# Patient Record
Sex: Female | Born: 2000 | Race: Black or African American | Hispanic: No | Marital: Single | Smoking: Never smoker
Health system: Southern US, Community
[De-identification: ages and names within clinical notes are randomized; demographics above are authoritative.]

## PROBLEM LIST (undated history)

## (undated) DIAGNOSIS — F419 Anxiety disorder, unspecified: Secondary | ICD-10-CM

---
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
## 2023-03-30 ENCOUNTER — Other Ambulatory Visit: Payer: Self-pay | Admitting: Neurology

## 2023-03-30 DIAGNOSIS — R251 Tremor, unspecified: Secondary | ICD-10-CM

## 2023-03-31 ENCOUNTER — Ambulatory Visit
Admission: RE | Admit: 2023-03-31 | Discharge: 2023-03-31 | Disposition: A | Discharge status: Home / Self Care | Payer: Medicaid Other | Source: Ambulatory Visit | Attending: Neurology | Admitting: Neurology

## 2023-03-31 DIAGNOSIS — R251 Tremor, unspecified: Secondary | ICD-10-CM | POA: Diagnosis present

## 2023-03-31 MED ORDER — GADOBUTROL 1 MMOL/ML IV SOLN
6.0000 mL | Freq: Once | INTRAVENOUS | Status: AC | PRN
  Administered 2023-03-31: 6 mL via INTRAVENOUS

## 2023-04-28 ENCOUNTER — Other Ambulatory Visit: Payer: Self-pay | Admitting: Neurology

## 2023-04-28 DIAGNOSIS — G379 Demyelinating disease of central nervous system, unspecified: Secondary | ICD-10-CM

## 2023-05-09 ENCOUNTER — Encounter: Payer: Self-pay | Admitting: Neurology

## 2023-05-20 ENCOUNTER — Other Ambulatory Visit: Payer: Medicaid Other

## 2023-05-20 ENCOUNTER — Inpatient Hospital Stay: Admission: RE | Admit: 2023-05-20 | Payer: Medicaid Other | Source: Ambulatory Visit

## 2023-05-21 ENCOUNTER — Other Ambulatory Visit: Payer: Self-pay

## 2023-05-21 ENCOUNTER — Emergency Department
Admission: EM | Admit: 2023-05-21 | Discharge: 2023-05-21 | Disposition: A | Discharge status: Home / Self Care | Payer: Medicaid Other | Attending: Emergency Medicine | Admitting: Emergency Medicine

## 2023-05-21 DIAGNOSIS — R259 Unspecified abnormal involuntary movements: Secondary | ICD-10-CM | POA: Diagnosis present

## 2023-05-21 DIAGNOSIS — G249 Dystonia, unspecified: Secondary | ICD-10-CM | POA: Diagnosis not present

## 2023-05-21 LAB — T4, FREE: Free T4: 0.78 ng/dL (ref 0.61–1.12)

## 2023-05-21 LAB — CBC WITH DIFFERENTIAL/PLATELET
Abs Immature Granulocytes: 0.03 10*3/uL (ref 0.00–0.07)
Basophils Absolute: 0.1 10*3/uL (ref 0.0–0.1)
Basophils Relative: 1 %
Eosinophils Absolute: 0.1 10*3/uL (ref 0.0–0.5)
Eosinophils Relative: 1 %
HCT: 35.4 % — ABNORMAL LOW (ref 36.0–46.0)
Hemoglobin: 11.3 g/dL — ABNORMAL LOW (ref 12.0–15.0)
Immature Granulocytes: 0 %
Lymphocytes Relative: 32 %
Lymphs Abs: 3.4 10*3/uL (ref 0.7–4.0)
MCH: 28.6 pg (ref 26.0–34.0)
MCHC: 31.9 g/dL (ref 30.0–36.0)
MCV: 89.6 fL (ref 80.0–100.0)
Monocytes Absolute: 1.1 10*3/uL — ABNORMAL HIGH (ref 0.1–1.0)
Monocytes Relative: 11 %
Neutro Abs: 6 10*3/uL (ref 1.7–7.7)
Neutrophils Relative %: 55 %
Platelets: 304 10*3/uL (ref 150–400)
RBC: 3.95 MIL/uL (ref 3.87–5.11)
RDW: 13.2 % (ref 11.5–15.5)
WBC: 10.7 10*3/uL — ABNORMAL HIGH (ref 4.0–10.5)
nRBC: 0 % (ref 0.0–0.2)

## 2023-05-21 LAB — TSH: TSH: 2.838 u[IU]/mL (ref 0.350–4.500)

## 2023-05-21 LAB — COMPREHENSIVE METABOLIC PANEL
ALT: 15 U/L (ref 0–44)
AST: 21 U/L (ref 15–41)
Albumin: 3.8 g/dL (ref 3.5–5.0)
Alkaline Phosphatase: 58 U/L (ref 38–126)
Anion gap: 7 (ref 5–15)
BUN: 16 mg/dL (ref 6–20)
CO2: 26 mmol/L (ref 22–32)
Calcium: 9.2 mg/dL (ref 8.9–10.3)
Chloride: 103 mmol/L (ref 98–111)
Creatinine, Ser: 0.68 mg/dL (ref 0.44–1.00)
GFR, Estimated: >60 mL/min (ref >60)
Glucose, Bld: 116 mg/dL — ABNORMAL HIGH (ref 70–99)
Potassium: 3.8 mmol/L (ref 3.5–5.1)
Sodium: 136 mmol/L (ref 135–145)
Total Bilirubin: <0.2 mg/dL (ref <1.2)
Total Protein: 7.9 g/dL (ref 6.5–8.1)

## 2023-05-21 LAB — HCG, QUANTITATIVE, PREGNANCY: hCG, Beta Chain, Quant, S: 1 m[IU]/mL (ref <5)

## 2023-05-21 LAB — MAGNESIUM: Magnesium: 1.8 mg/dL (ref 1.7–2.4)

## 2023-05-21 MED ORDER — DIPHENHYDRAMINE HCL 50 MG/ML IJ SOLN
50.0000 mg | Freq: Once | INTRAMUSCULAR | Status: AC
  Administered 2023-05-21: 50 mg via INTRAVENOUS
  Filled 2023-05-21: qty 1

## 2023-05-21 NOTE — Discharge Instructions (Signed)
 Thank you for choosing [facility] for your health care today!  Please see your primary doctor this week for a follow up appointment.   If you have any new, worsening, or unexpected symptoms call your doctor right away or come back to the emergency department for reevaluation.  It was my pleasure to care for you today.   Daneil Dan Modesto Charon, MD

## 2023-05-21 NOTE — ED Notes (Signed)
Pt ambulated unassisted to the bathroom. This nurse was at bedside for safety.

## 2023-05-21 NOTE — ED Provider Notes (Signed)
[facility] Provider Note    Event Date/Time   First MD Initiated Contact with Patient 05/21/23 [phone]     (approximate)   History   Medical Clearance   HPI  Karen Skinner is a 22 y.o. female   Past medical history of PTSD and anxiety who presents to the emergency department with abnormal movements.  According to the patient and her mother, regular state of health until this evening around 11 PM when she came down the stairs with jerking movements throughout her whole body, able to tell her mother that she was having these jerking movements and able to walk at that time, mother noted that she was dragging both of her feet while she walked.  Mother notes that she has had some dystonic reactions limited to various parts of her body but not sustained like tonight.  Patient denies any pain or discomfort.  No drug or alcohol use.  No changes in medications recently.  Takes duloxetine, longstanding.   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      Physical Exam   Triage Vital Signs: ED Triage Vitals  Encounter Vitals Group     BP 05/21/23 0040 122/80     Systolic BP Percentile --      Diastolic BP Percentile --      Pulse Rate 05/21/23 0040 (!) 106     Resp 05/21/23 0040 17     Temp 05/21/23 0040 (!) 97.4 F (36.3 C)     Temp Source 05/21/23 0040 Oral     SpO2 05/21/23 0040 100 %     Weight --      Height --      Head Circumference --      Peak Flow --      Pain Score 05/21/23 0041 0     Pain Loc --      Pain Education --      Exclude from Growth Chart --     Most recent vital signs: Vitals:   05/21/23 0040  BP: 122/80  Pulse: (!) 106  Resp: 17  Temp: (!) 97.4 F (36.3 C)  SpO2: 100%    General: Awake, no distress.  CV:  Good  peripheral perfusion.  Resp:  Normal effort.  Abd:  No distention.  Other:  Patient with occasional jerking motions throughout her entire body, distractible and subsides when engaged in conversation or asked to perform physical exam maneuvers.  Able to move all extremities.  No clonus.  Awake alert oriented cooperative.   ED Results / Procedures / Treatments   Labs (all labs ordered are listed, but only abnormal results are displayed) Labs Reviewed  COMPREHENSIVE METABOLIC PANEL - Abnormal; Notable for the following components:      Result Value   Glucose, Bld 116 (*)    All other components within normal limits  CBC WITH DIFFERENTIAL/PLATELET - Abnormal; Notable for the following components:   WBC 10.7 (*)    Hemoglobin 11.3 (*)    HCT 35.4 (*)    Monocytes Absolute 1.1 (*)    All other components within normal limits  HCG, QUANTITATIVE, PREGNANCY  MAGNESIUM  TSH  T4, FREE     I ordered and reviewed the above labs they are notable for normal cell counts, electrolytes, thyroid studies, pregnancy test negative.  EKG  ED ECG REPORT  Ross Marcus, the attending physician, personally viewed and interpreted this ECG.   Date: 05/21/2023  EKG Time: 0055  Rate: 100  Rhythm: sinus tachycardia  Axis: nl  Intervals:none  ST&T Change: no stemi    PROCEDURES:  Critical Care performed: No  Procedures   MEDICATIONS ORDERED IN ED: Medications  diphenhydrAMINE (BENADRYL) injection 50 mg (50 mg Intravenous Given 05/21/23 0049)     IMPRESSION / MDM / ASSESSMENT AND PLAN / ED COURSE  I reviewed the triage vital signs and the nursing notes.                                Patient's presentation is most consistent with acute presentation with potential threat to life or bodily function.  Differential diagnosis includes, but is not limited to, dystonic reaction, functional neurologic disorder, electrolyte disturbance, arrhythmia, thyroid dysfunction, considered but less  likely seizure or stroke.   The patient is on the cardiac monitor to evaluate for evidence of arrhythmia and/or significant heart rate changes.  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  Check basic labs, EKG, pregnancy.  Give Benadryl.  Without intervention, jerking movements have mostly subsided.  If labs unremarkable, patient remains well, plan will be for discharge and neurology follow-up.   Reassessed and resting comfortably.  Moving all extremities.  Reviewed lab testing with patient and mother.  All normal.  Will follow-up with neurologist.      FINAL CLINICAL IMPRESSION(S) / ED DIAGNOSES   Final diagnoses:  Dystonia     Rx / DC Orders   ED Discharge Orders     None        Note:  This document was prepared using Dragon voice recognition software and may include unintentional dictation errors.Pilar Jarvis, MD 05/21/23 [phone]

## 2023-05-21 NOTE — ED Triage Notes (Signed)
Patient is having dystonic type movements in the ED. Pt A/ox4, able to answer questions. Pt not in distress at this time.

## 2024-02-02 ENCOUNTER — Ambulatory Visit
Admission: EM | Admit: 2024-02-02 | Discharge: 2024-02-02 | Disposition: A | Discharge status: Home / Self Care | Attending: Emergency Medicine | Admitting: Emergency Medicine

## 2024-02-02 ENCOUNTER — Encounter: Payer: Self-pay | Admitting: Emergency Medicine

## 2024-02-02 DIAGNOSIS — J069 Acute upper respiratory infection, unspecified: Secondary | ICD-10-CM | POA: Diagnosis present

## 2024-02-02 DIAGNOSIS — H9203 Otalgia, bilateral: Secondary | ICD-10-CM | POA: Insufficient documentation

## 2024-02-02 LAB — SARS CORONAVIRUS 2 BY RT PCR: SARS Coronavirus 2 by RT PCR: NEGATIVE

## 2024-02-02 LAB — GROUP A STREP BY PCR: Group A Strep by PCR: NOT DETECTED

## 2024-02-02 MED ORDER — ALBUTEROL SULFATE HFA 108 (90 BASE) MCG/ACT IN AERS: 1.0000 | INHALATION_SPRAY | RESPIRATORY_TRACT | 0 refills | Status: AC | PRN

## 2024-02-02 MED ORDER — PREDNISONE 20 MG PO TABS
40.0000 mg | ORAL_TABLET | Freq: Every day | ORAL | 0 refills | Status: AC
Start: 2024-02-02 — End: 2024-02-07

## 2024-02-02 MED ORDER — IBUPROFEN 600 MG PO TABS: 600.0000 mg | ORAL_TABLET | Freq: Three times a day (TID) | ORAL | 0 refills | Status: AC | PRN

## 2024-02-02 MED ORDER — AEROCHAMBER MV MISC: 1 refills | Status: AC

## 2024-02-02 MED ORDER — FLUTICASONE PROPIONATE 50 MCG/ACT NA SUSP: 2.0000 | Freq: Every day | NASAL | 0 refills | Status: AC

## 2024-02-02 MED ORDER — PROMETHAZINE-DM 6.25-15 MG/5ML PO SYRP: 5.0000 mL | ORAL_SOLUTION | Freq: Four times a day (QID) | ORAL | 0 refills | Status: AC | PRN

## 2024-02-02 NOTE — ED Triage Notes (Signed)
 Pt presents c/o sore throat,a productive cough, nasal congestion and bilateral ear pain x 2 days. Pt reports coughing up greens mucus. Pt denies emesis and diarrhea but does c/o nausea. Pt has not tried any OTC products to help relieve the sxs.

## 2024-02-02 NOTE — ED Provider Notes (Signed)
 HPI  SUBJECTIVE:  Karen Skinner is a 23 y.o. female who presents with bilateral ear pain, decreased hearing, nasal congestion, fatigue, clear rhinorrhea, postnasal drip, sore throat starting yesterday.  She reports cough productive of mucus, chest soreness from coughing, intermittent shortness of breath, and is unable to sleep at night because of the cough some nausea, no vomiting, diarrhea, abdominal pain, body aches or headaches.  No wheezing, no fevers, recent swimming, sinus pain or pressure.  No allergy symptoms.  She uses Q-tips and earbuds and also grinds her teeth.  No known strep, COVID, flu exposure, but she has multiple family members with similar symptoms.  No antibiotics in the past month.  No antipyretic in the past 6 hours.  She tried an unknown pill and throat lozenges without improvement in her symptoms.  Symptoms worse with eating exertion.  She has a past history of frequent otitis media, anxiety/panic attack.  No history of TMJ arthralgia, allergies, diabetes, asthma.  LMP: Last week.  Denies possibility of being pregnant.  PCP: None    Past Medical History:  Diagnosis Date   Anxiety     History reviewed. No pertinent surgical history.  Family History  Problem Relation Age of Onset   Healthy Mother    Healthy Father     Social History   Tobacco Use   Smoking status: Never    Passive exposure: Never   Smokeless tobacco: Never  Vaping Use   Vaping status: Never Used  Substance Use Topics   Alcohol use: No   Drug use: No    No current facility-administered medications for this encounter.  Current Outpatient Medications:    albuterol  (VENTOLIN  HFA) 108 (90 Base) MCG/ACT inhaler, Inhale 1-2 puffs into the lungs every 4 (four) hours as needed for wheezing or shortness of breath., Disp: 1 each, Rfl: 0   busPIRone (BUSPAR) 7.5 MG tablet, Take 7.5 mg by mouth., Disp: , Rfl:    DULoxetine (CYMBALTA) 60 MG capsule, Take 60 mg by mouth., Disp: , Rfl:    fluticasone   (FLONASE ) 50 MCG/ACT nasal spray, Place 2 sprays into both nostrils daily., Disp: 16 g, Rfl: 0   ibuprofen  (ADVIL ) 600 MG tablet, Take 1 tablet (600 mg total) by mouth every 8 (eight) hours as needed., Disp: 30 tablet, Rfl: 0   lamoTRIgine (LAMICTAL) 25 MG tablet, Take 25 mg by mouth., Disp: , Rfl:    predniSONE  (DELTASONE ) 20 MG tablet, Take 2 tablets (40 mg total) by mouth daily with breakfast for 5 days., Disp: 10 tablet, Rfl: 0   promethazine -dextromethorphan (PROMETHAZINE -DM) 6.25-15 MG/5ML syrup, Take 5 mLs by mouth 4 (four) times daily as needed for cough., Disp: 118 mL, Rfl: 0   Spacer/Aero-Holding Chambers (AEROCHAMBER MV) inhaler, Use as instructed, Disp: 1 each, Rfl: 1   Vitamin D, Ergocalciferol, (DRISDOL) 1.25 MG (50000 UNIT) CAPS capsule, Take 50,000 Units by mouth once a week., Disp: , Rfl:    FLUoxetine (PROZAC) 20 MG capsule, TAKE 1 CAPSULE BY MOUTH IN THE MORNING FOR 30 DAYS, Disp: , Rfl: 1   Fluoxetine HCl, PMDD, 20 MG TABS, Take 20 mg by mouth., Disp: , Rfl:    hydrOXYzine (ATARAX) 25 MG tablet, Take 12.5 mg by mouth at bedtime., Disp: , Rfl:   No Known Allergies   ROS  As noted in HPI.   Physical Exam  BP 99/61 (BP Location: Left Arm)   Pulse 87   Temp 99.5 F (37.5 C) (Oral)   Resp 16   Wt  83.1 kg   LMP 01/25/2024 (Exact Date)   SpO2 99%   Constitutional: Well developed, well nourished, no acute distress.  Coughing. Eyes: PERRL, EOMI, conjunctiva normal bilaterally HENT: Normocephalic, atraumatic,mucus membranes moist.  Slightly decreased hearing right ear compared to left.  Bilateral external ears, EACs, TMs normal.  No pain with traction on pinna, palpation of tragus or mastoid.  Mild tenderness over the TMJ bilaterally when fully opening mouth.  No jaw crepitus.  Pale, swollen turbinates with clear nasal congestion.  No maxillary, frontal sinus tenderness.  Normal oropharynx.  Normal tonsils out exudates.  Uvula midline. Neck: No cervical  lymphadenopathy. Respiratory: Clear to auscultation bilaterally, no rales, no wheezing, no rhonchi.  No anterior, lateral chest wall tenderness Cardiovascular: Normal rate and rhythm, no murmurs, no gallops, no rubs GI: nondistended skin: No rash, skin intact Musculoskeletal: no deformities Neurologic: Alert & oriented x 3, CN III-XII grossly intact, no motor deficits, sensation grossly intact Psychiatric: Speech and behavior appropriate   ED Course   Medications - No data to display  Orders Placed This Encounter  Procedures   Group A Strep by PCR    Standing Status:   Standing    Number of Occurrences:   1   SARS Coronavirus 2 by RT PCR (hospital order, performed in [facility] hospital lab) *cepheid single result test* Anterior Nasal Swab    Standing Status:   Standing    Number of Occurrences:   1   Results for orders placed or performed during the hospital encounter of 02/02/24 (from the past 24 hours)  Group A Strep by PCR     Status: None   Collection Time: 02/02/24 11:16 AM   Specimen: Throat; Sterile Swab  Result Value Ref Range   Group A Strep by PCR NOT DETECTED NOT DETECTED  SARS Coronavirus 2 by RT PCR (hospital order, performed in [facility] hospital lab) *cepheid single result test* Anterior Nasal Swab     Status: None   Collection Time: 02/02/24 11:16 AM   Specimen: Anterior Nasal Swab  Result Value Ref Range   SARS Coronavirus 2 by RT PCR NEGATIVE NEGATIVE   No results found.  ED Clinical Impression  1. Viral URI with cough   2. Otalgia of both ears      ED Assessment/Plan    COVID, strep negative.  Discussed with patient while in department.  Presentation consistent with an upper respiratory infection.  Suspect otalgia could be from eustachian tube dysfunction or TMJ arthralgia.  She has no evidence of otitis externa or otitis media.  Home with Promethazine  DM, Flonase , saline nasal irrigation, Mucinex D, prednisone  40 mg for 5 days, will refill her  albuterol  inhaler with a spacer every 4-6 hours as needed for coughing, shortness of breath.  Tylenol /ibuprofen  together 3-4 times daily as needed for pain.  Work note.  She is to return here for double sickening, if not better after 10 days, or severe symptoms such as high fevers, facial swelling, upper dental pain, and we can reevaluate the need for antibiotics.  Discussed labs, MDM, treatment plan, and plan for follow-up with patient. patient agrees with plan.   Meds ordered this encounter  Medications   fluticasone  (FLONASE ) 50 MCG/ACT nasal spray    Sig: Place 2 sprays into both nostrils daily.    Dispense:  16 g    Refill:  0   albuterol  (VENTOLIN  HFA) 108 (90 Base) MCG/ACT inhaler    Sig: Inhale 1-2 puffs into the lungs  every 4 (four) hours as needed for wheezing or shortness of breath.    Dispense:  1 each    Refill:  0   predniSONE  (DELTASONE ) 20 MG tablet    Sig: Take 2 tablets (40 mg total) by mouth daily with breakfast for 5 days.    Dispense:  10 tablet    Refill:  0   ibuprofen  (ADVIL ) 600 MG tablet    Sig: Take 1 tablet (600 mg total) by mouth every 8 (eight) hours as needed.    Dispense:  30 tablet    Refill:  0   Spacer/Aero-Holding Chambers (AEROCHAMBER MV) inhaler    Sig: Use as instructed    Dispense:  1 each    Refill:  1   promethazine -dextromethorphan (PROMETHAZINE -DM) 6.25-15 MG/5ML syrup    Sig: Take 5 mLs by mouth 4 (four) times daily as needed for cough.    Dispense:  118 mL    Refill:  0      *This clinic note was created using Scientist, clinical (histocompatibility and immunogenetics). Therefore, there may be occasional mistakes despite careful proofreading. ?    Van Knee, MD 02/05/24 1053

## 2024-02-02 NOTE — Discharge Instructions (Signed)
 Promethazine  DM, Flonase , saline nasal irrigation with a NeilMed rinse and distilled water as often as you want, Mucinex D, prednisone  40 mg for 5 days.  2 puffs from your albuterol  inhaler with a spacer every 4-6 hours as needed for coughing, shortness of breath.  1000 mg of Tylenol  combined with 600 mg of ibuprofen  together 3-4 times daily as needed for pain. Return here for double sickening, if not better after 10 days, or severe symptoms such as high fevers, facial swelling, upper dental pain, and we can reevaluate the need for antibiotics.
